# Patient Record
Sex: Male | Born: 1955 | Race: White | Hispanic: No | Marital: Married
Health system: Southern US, Community
[De-identification: ages and names within clinical notes are randomized; demographics above are authoritative.]

---

## 2003-07-08 ENCOUNTER — Other Ambulatory Visit: Payer: Self-pay

## 2008-11-02 ENCOUNTER — Ambulatory Visit: Payer: Self-pay | Admitting: Gastroenterology

## 2011-01-01 ENCOUNTER — Ambulatory Visit: Payer: Self-pay | Admitting: Internal Medicine

## 2011-01-01 ENCOUNTER — Inpatient Hospital Stay: Payer: Self-pay | Admitting: Internal Medicine

## 2011-01-09 LAB — PATHOLOGY REPORT

## 2011-03-13 ENCOUNTER — Ambulatory Visit: Payer: Self-pay | Admitting: Surgery

## 2011-05-09 ENCOUNTER — Ambulatory Visit: Payer: Self-pay | Admitting: Surgery

## 2011-05-16 ENCOUNTER — Inpatient Hospital Stay: Payer: Self-pay | Admitting: Surgery

## 2011-12-28 ENCOUNTER — Inpatient Hospital Stay: Payer: Self-pay | Admitting: Internal Medicine

## 2011-12-28 LAB — COMPREHENSIVE METABOLIC PANEL
Anion Gap: 9 (ref 7–16)
Bilirubin,Total: 0.8 mg/dL (ref 0.2–1.0)
Chloride: 103 mmol/L (ref 98–107)
Co2: 30 mmol/L (ref 21–32)
Creatinine: 1.27 mg/dL (ref 0.60–1.30)
EGFR (African American): >60
EGFR (Non-African Amer.): >60
Osmolality: 286 (ref 275–301)
Potassium: 2.9 mmol/L — ABNORMAL LOW (ref 3.5–5.1)
Sodium: 142 mmol/L (ref 136–145)

## 2011-12-28 LAB — URINALYSIS, COMPLETE
Bacteria: NONE SEEN
Bilirubin,UR: NEGATIVE
Blood: NEGATIVE
Glucose,UR: NEGATIVE mg/dL (ref 0–75)
Hyaline Cast: 3
Ketone: NEGATIVE
Leukocyte Esterase: NEGATIVE
Specific Gravity: 1.009 (ref 1.003–1.030)
Squamous Epithelial: NONE SEEN
WBC UR: 1 /HPF (ref 0–5)

## 2011-12-28 LAB — CBC
HCT: 54.8 % — ABNORMAL HIGH (ref 40.0–52.0)
HGB: 18.4 g/dL — ABNORMAL HIGH (ref 13.0–18.0)
MCH: 34.2 pg — ABNORMAL HIGH (ref 26.0–34.0)
MCHC: 33.6 g/dL (ref 32.0–36.0)
Platelet: 242 10*3/uL (ref 150–440)
RDW: 13.4 % (ref 11.5–14.5)
WBC: 10.8 10*3/uL — ABNORMAL HIGH (ref 3.8–10.6)

## 2011-12-28 LAB — CK TOTAL AND CKMB (NOT AT ARMC)
CK, Total: 107 U/L (ref 35–232)
CK-MB: 1.6 ng/mL (ref 0.5–3.6)

## 2011-12-28 LAB — TROPONIN I
Troponin-I: 0.06 ng/mL — ABNORMAL HIGH
Troponin-I: 0.19 ng/mL — ABNORMAL HIGH

## 2011-12-29 LAB — CK TOTAL AND CKMB (NOT AT ARMC): CK-MB: 1.6 ng/mL (ref 0.5–3.6)

## 2011-12-29 LAB — BASIC METABOLIC PANEL
BUN: 16 mg/dL (ref 7–18)
Calcium, Total: 8.2 mg/dL — ABNORMAL LOW (ref 8.5–10.1)
Creatinine: 0.78 mg/dL (ref 0.60–1.30)
EGFR (African American): >60
EGFR (Non-African Amer.): >60
Glucose: 91 mg/dL (ref 65–99)
Potassium: 3.3 mmol/L — ABNORMAL LOW (ref 3.5–5.1)

## 2011-12-29 LAB — CBC WITH DIFFERENTIAL/PLATELET
Basophil #: 0.1 10*3/uL (ref 0.0–0.1)
Basophil %: 0.9 %
Eosinophil %: 1.9 %
Lymphocyte #: 3.3 10*3/uL (ref 1.0–3.6)
Lymphocyte %: 22.2 %
MCV: 102 fL — ABNORMAL HIGH (ref 80–100)
Monocyte %: 5.4 %
Neutrophil #: 10.3 10*3/uL — ABNORMAL HIGH (ref 1.4–6.5)
RDW: 13.1 % (ref 11.5–14.5)

## 2011-12-29 LAB — TROPONIN I: Troponin-I: 0.07 ng/mL — ABNORMAL HIGH

## 2011-12-29 LAB — MAGNESIUM: Magnesium: 1.8 mg/dL

## 2012-05-02 ENCOUNTER — Emergency Department: Payer: Self-pay | Admitting: Emergency Medicine

## 2012-05-02 LAB — COMPREHENSIVE METABOLIC PANEL
Alkaline Phosphatase: 83 U/L (ref 50–136)
BUN: 14 mg/dL (ref 7–18)
Bilirubin,Total: 0.6 mg/dL (ref 0.2–1.0)
Calcium, Total: 8.8 mg/dL (ref 8.5–10.1)
Co2: 29 mmol/L (ref 21–32)
Creatinine: 0.98 mg/dL (ref 0.60–1.30)
EGFR (Non-African Amer.): >60
Glucose: 112 mg/dL — ABNORMAL HIGH (ref 65–99)
Osmolality: 286 (ref 275–301)
Potassium: 3.1 mmol/L — ABNORMAL LOW (ref 3.5–5.1)
SGOT(AST): 25 U/L (ref 15–37)
SGPT (ALT): 33 U/L (ref 12–78)
Sodium: 143 mmol/L (ref 136–145)

## 2012-05-02 LAB — CBC
HCT: 46.1 % (ref 40.0–52.0)
MCH: 35.8 pg — ABNORMAL HIGH (ref 26.0–34.0)
MCHC: 35.3 g/dL (ref 32.0–36.0)
Platelet: 289 10*3/uL (ref 150–440)
RBC: 4.55 10*6/uL (ref 4.40–5.90)
RDW: 12.4 % (ref 11.5–14.5)
WBC: 8.6 10*3/uL (ref 3.8–10.6)

## 2012-05-02 LAB — CK TOTAL AND CKMB (NOT AT ARMC)
CK, Total: 96 U/L (ref 35–232)
CK-MB: 1.9 ng/mL (ref 0.5–3.6)

## 2012-05-02 LAB — TROPONIN I: Troponin-I: <0.02 ng/mL

## 2013-02-07 IMAGING — US US CAROTID DUPLEX BILAT
1 series · 14 of 24 positions shown · non-contrast
Comparison: none

REASON FOR EXAM: syncope/presyncope
COMMENTS:

[Series 1: us carotid duplex bilat · 14 of 64 slices shown]
[im 1/64]
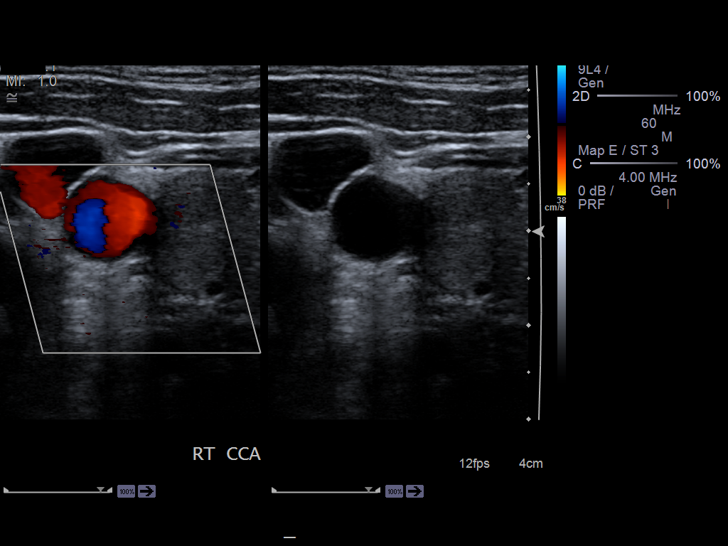
[im 6/64]
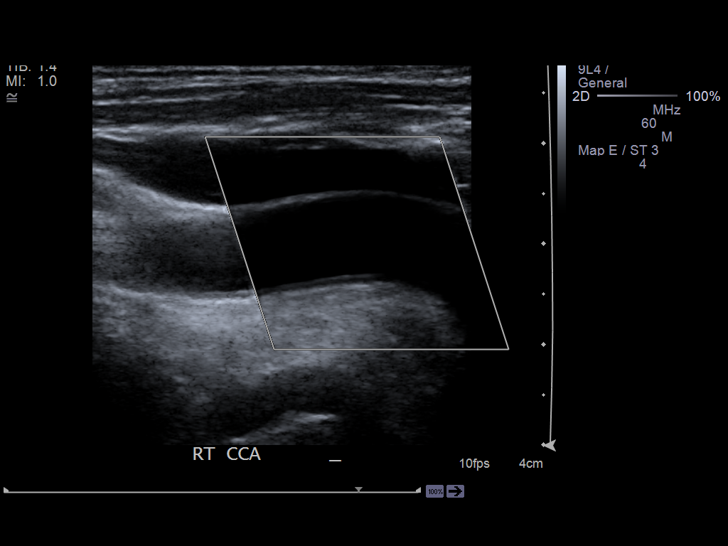
[im 11/64]
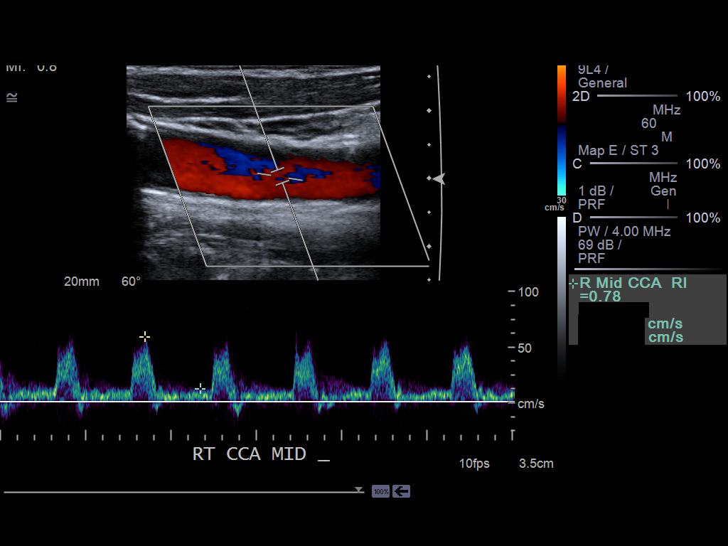
[im 17/64]
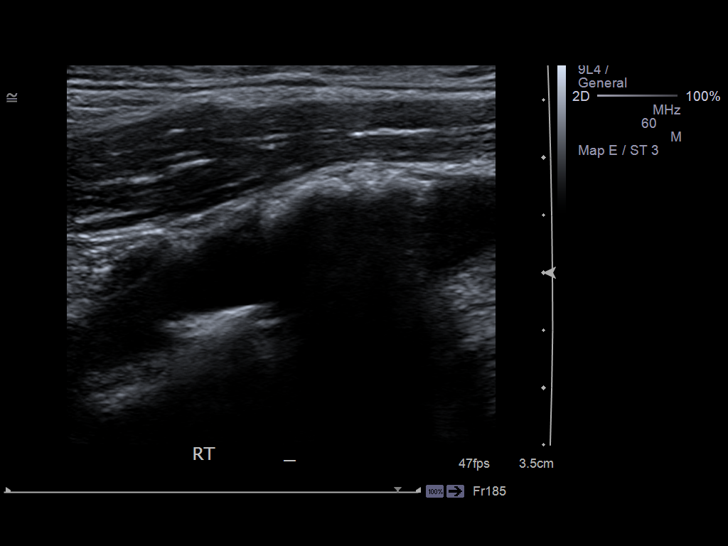
[im 20/64]
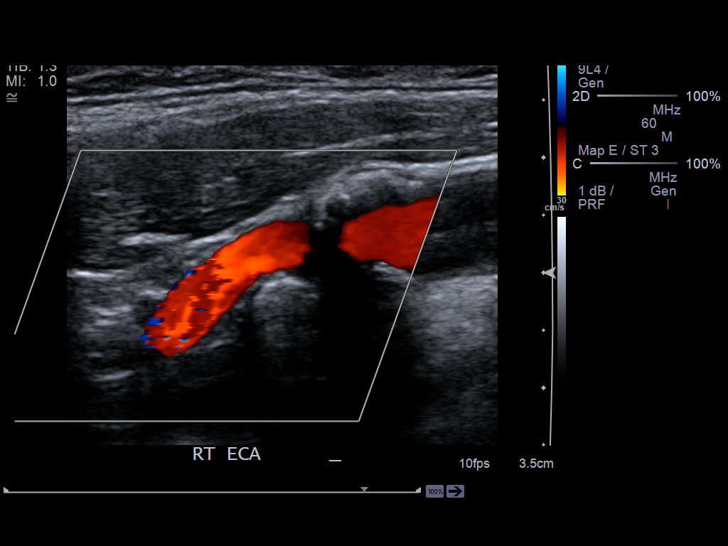
[im 25/64]
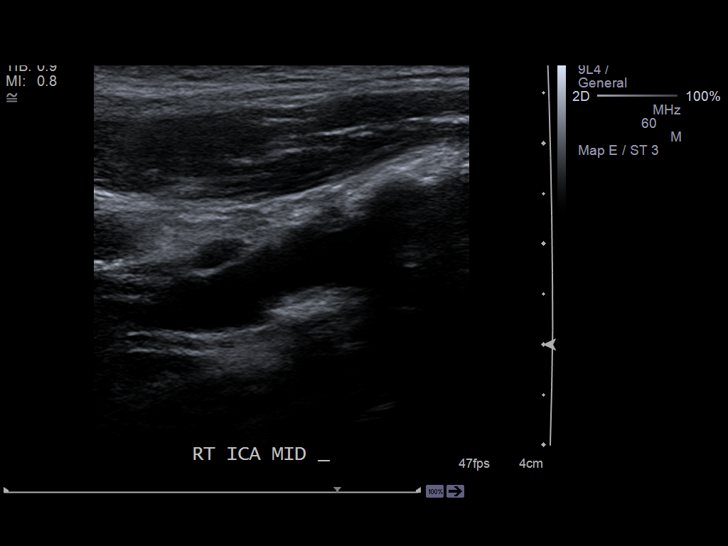
[im 31/64]
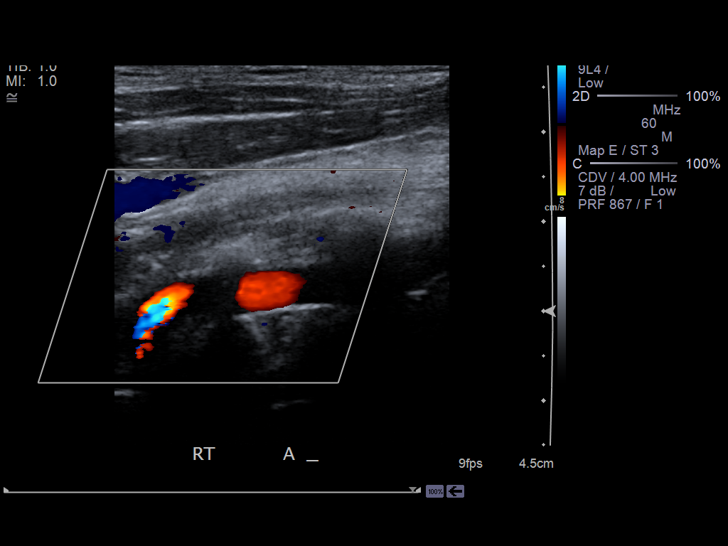
[im 33/64]
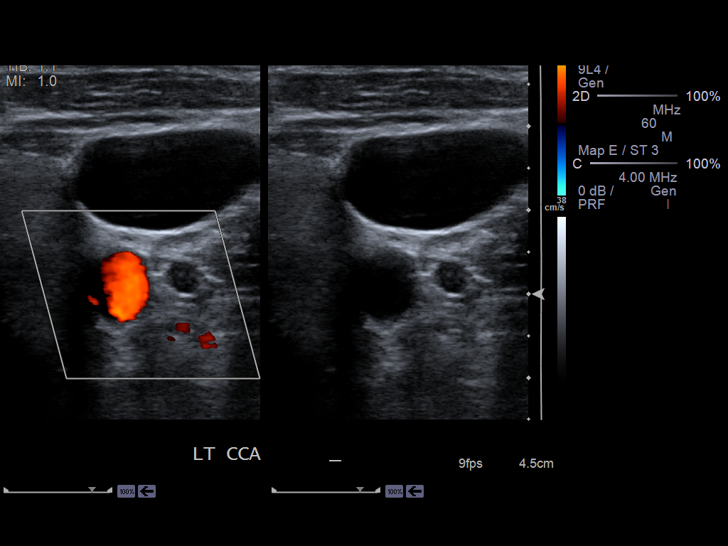
[im 39/64]
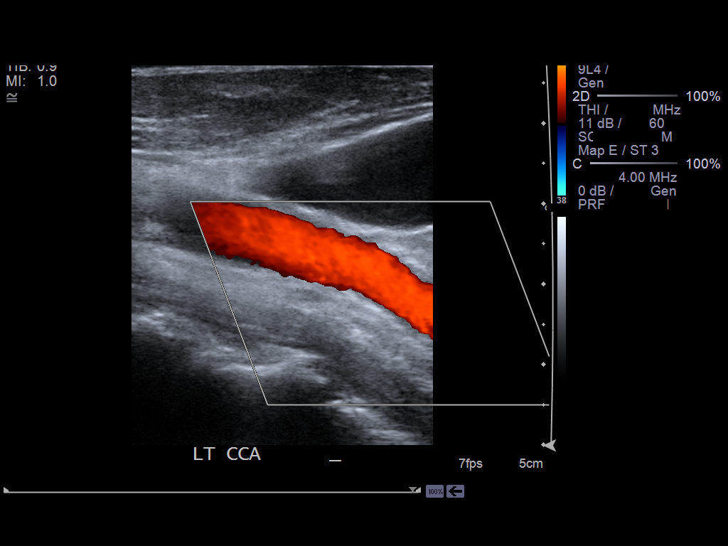
[im 44/64]
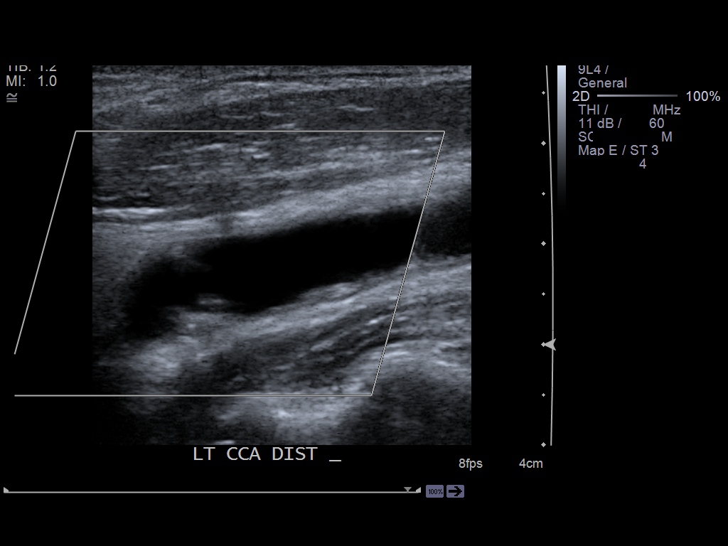
[im 50/64]
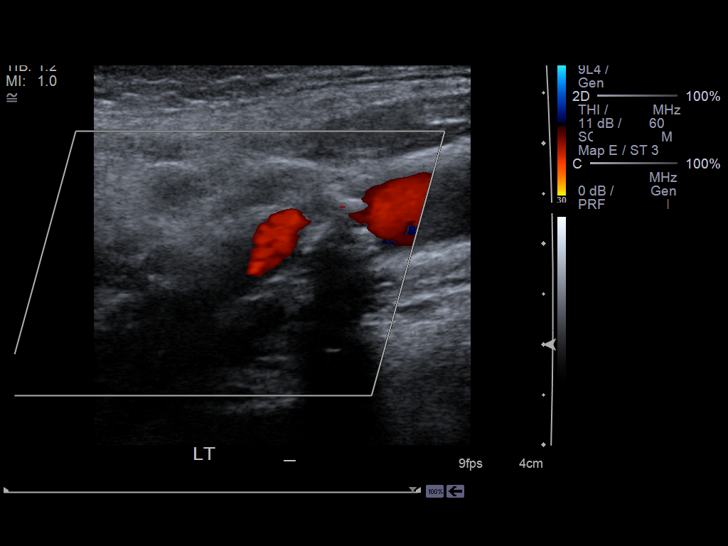
[im 53/64]
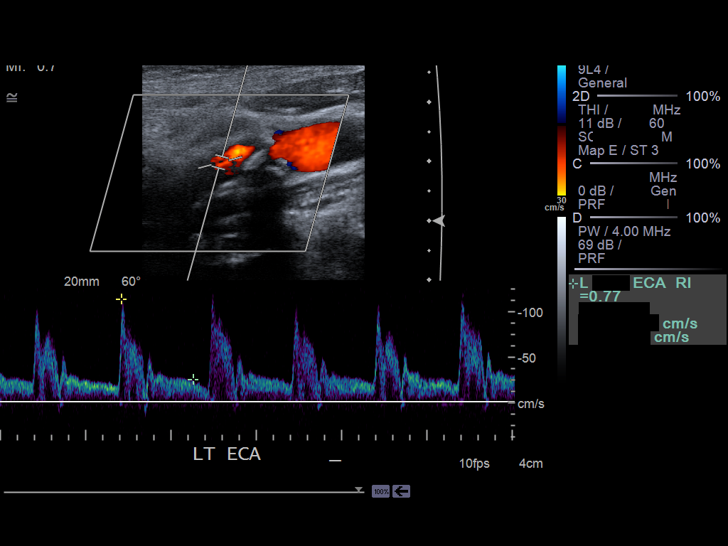
[im 58/64]
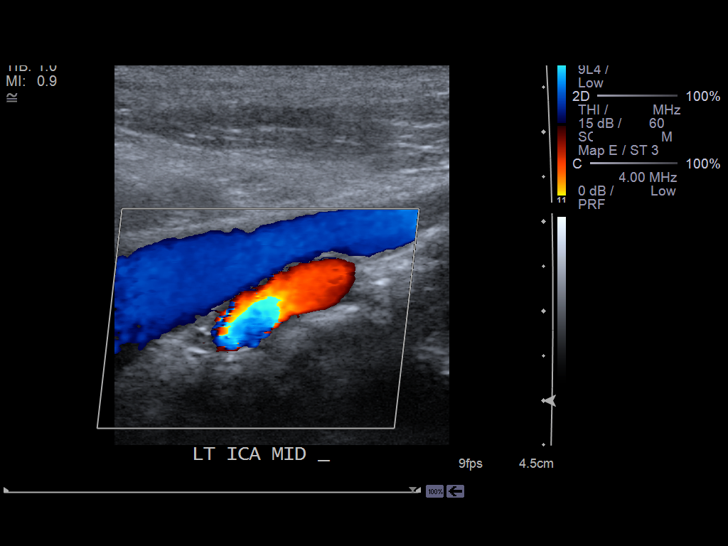
[im 64/64]
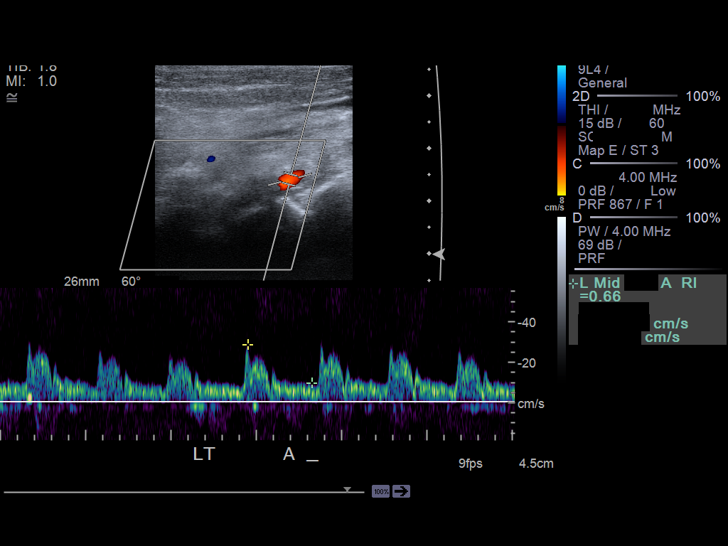

[14 of 24 positions shown; findings below may reference images not displayed]

PROCEDURE:     US  - US CAROTID DOPPLER BILATERAL  - December 29, 2011  [DATE]

RESULT:     Standard carotid color flow duplex Doppler reveals calcific
plaque at both carotid bifurcations with degree of stenosis less than 50%.
Peak systolic flow velocity ratio the right is 1.6, left 1.1. Vertebrals are
patent with antegrade flow.
IMPRESSION: Bilateral carotid atherosclerotic vascular disease with
degree of stenosis less than 50%.

## 2013-04-11 DEATH — deceased

## 2014-09-28 NOTE — H&P (Signed)
PATIENT NAME:  Darren Morrow MR#:  [id number] DATE OF BIRTH:  [birth year]  DATE OF ADMISSION:  12/28/2011  PRIMARY CARE PHYSICIAN: Vonita Moss, MD   ER REFERRING PHYSICIAN:  Glennie Isle, MD     CHIEF COMPLAINT: Trouble breathing.   HISTORY OF PRESENT ILLNESS: The patient is a 59 year old male with a history of hypertension who came in because he had trouble breathing.  The patient was working on a pressure washing machine to take it to the patient's wife's office to do the pressure washing, and on the way to the office the patient had sudden onset of shortness of breath with diaphoresis and dizziness.  He called the ambulance, and in the ambulance he was given oxygen and brought in here.  He is found to have a slightly elevated troponin of 0.06 along with hyperkalemia and hypertension.  I was asked to admit for heat exhaustion with hyperkalemia and also non ST-elevation myocardial infarction. The patient denies any chest pain, has mild trouble breathing, no cough, and the patient has no wheezing, and shortness of breath is since this morning.  He has been working outside the whole week on the yard, and the trouble breathing happened this morning with sudden onset with dizziness, and he was about to pass out. He did not have any chest pain. The patient did have some nausea. The patient took some baby aspirin at home.   PAST MEDICAL HISTORY:  1. Hypertension.  2. The patient also has history of admission in 05/2011 colostomy closure, and the patient had a history of diverticulitis before. He had a history of diverticulitis with perforation and abscess in August and at that  time he had sigmoid colectomy and colostomy. In December he had a reversal of colostomy.   ALLERGIES: Norco.   SOCIAL HISTORY: Heavy smoker up to 2 packs per day. The patient is a very heavy alcoholic, drinks heavily. No drugs.   MEDICATIONS:  1. Aspirin 81 mg daily. 2. Benicar HCTZ 40/25 mg p.o. daily.  3. Coreg CR  40 mg p.o. daily.  4. Diltiazem 360 mg daily.   FAMILY HISTORY: The patient's mother had a history of emphysema and angina. Father had hypertension.   PAST SURGICAL HISTORY: Sigmoid colectomy.   REVIEW OF SYSTEMS: CONSTITUTIONAL: He has no fever. No fatigue. EYES: No blurred vision. ENT: No tinnitus. No epistaxis. No difficulty swallowing. RESPIRATORY: Has chronic smokers cough and trouble breathing this morning. CARDIOVASCULAR: No chest pain. No palpitations. He was about to pass out and trouble breathing happened, but he did not pass out. GASTROINTESTINAL: No nausea. No vomiting. No constipation. GENITOURINARY: No dysuria. INTEGUMENTARY: No skin rashes. MUSCULOSKELETAL: No joint pain. NEUROLOGIC: No numbness or weakness. PSYCHIATRIC: No anxiety or insomnia.   PHYSICAL EXAMINATION:  VITAL SIGNS: Temperature 96.2, pulse 61, blood pressure initially was 100/77, but a repeat blood pressure was 140/80 and pulse 61.   GENERAL: The patient is an alert, awake, oriented and pleasant male has a chronic ill-looking appearance.   HEENT: Pallor. Extraocular movements are intact. Sclera anicteric. No conjunctivitis. Hearing is intact. Tympanic membranes are clear. No pharynx erythema. Mucous membranes are slightly dry.   NECK: Thyroid not enlarged. No JVD. No thyromegaly. No lymphadenopathy.   RESPIRATORY: Mild expiratory wheeze in all lung fields. Not using accessory muscles of respiration.   CARDIOVASCULAR: S1 and S2 regular. No murmurs. PMI. not displaced. Good pedal pulses present and good femoral pulses.   ABDOMEN: Soft, nontender, nondistended. Bowel sounds present.   MUSCULOSKELETAL: Strength  5/5 in upper and lower extremities.  SKIN: No skin rashes. No lymphadenopathy.   NEUROLOGIC: Alert, awake, oriented. Cranial nerves II through XII intact. Power 5/5 in upper and lower extremities. Sensations are intact. Deep tendon reflexes are 2+  bilaterally.   PSYCHIATRIC: Slightly anxious, oriented  to time, place, person and cooperative.   LABORATORY, DIAGNOSTIC AND RADIOLOGICAL DATA: The patient's chest x-ray showed mild hyperinflation with chronic obstructive pulmonary disease, no evidence of pneumonia. Cannot exclude acute bronchitis. CK total 107, CPK-MB 1.6. WBC is slightly elevated at 10.8, hemoglobin 18.4, hematocrit 54.8, platelets 242. Electrolytes: Sodium 142, potassium 2.9, chloride 103, bicarbonate 30, BUN 17, creatinine 1.27, glucose 120. EKG showed sinus brady 59 beats and right bundle branch block and no other ST-T changes. Troponin is slightly elevated at 0.06.   ASSESSMENT AND PLAN: The patient is a 59 year old male with:  1. Sudden onset of shortness of breath with dizziness and hypokalemia: Possibly secondary to heat exhaustion, working in the outside the whole week. So, we are going to admit him and give him IV fluids with potassium, and hold his Benicar and his HCTZ combination, and monitor him on telemetry.  2. History of hypertension: Blood pressure is borderline, so we will hold the blood pressure medications, including Coreg and diltiazem. Heart rate is around 61, so if heart rate goes up to 100 we can restart the Coreg ER.  3. Slightly elevated troponins, has no chest pain: No prior EKGs to compare. The patient was here in November and December, but I do not see any EKG. The patient says that he sees Dr. Darrold JunkerParaschos, had a Cardiology clearance before his diverticular surgery. So, the patient at this time has no chest pain, and we are going to try to get troponins and get echocardiogram. Continue aspirin and use sublingual nitroglycerin as needed for chest pain.  4. Dyspnea with some wheezing: He has chronic obstructive pulmonary disease with severe history of smoking. So at this time we can use Combivent metered-dose inhaler along with Spiriva and also add  antibiotics because he has symptoms of some cough and also trouble breathing, and chest x-ray evidence of bronchitis  ,elevated white count, so we will start him on Z-Pak.   CODE STATUS:  I discussed the CODE STATUS with the patient and he is FULL CODE.      TIME SPENT ON ADMISSION: About 60 minutes  ____________________________ Katha HammingSnehalatha Naaman Curro, MD sk:cbb D: 12/28/2011 14:50:17 ET T: 12/28/2011 15:05:39 ET JOB#: [id number]  cc: Katha HammingSnehalatha Taji Sather, MD, <Dictator> Katha HammingSNEHALATHA Praveen Coia MD ELECTRONICALLY SIGNED 01/14/2012 21:54

## 2014-09-28 NOTE — Discharge Summary (Signed)
PATIENT NAME:  Darren Morrow, Darren Morrow MR#:  [id number] DATE OF BIRTH:  1956-02-02  DATE OF ADMISSION:  12/28/2011 DATE OF DISCHARGE:  12/29/2011  ADMITTING DIAGNOSIS: Presyncope, questionable heat exhaustion.  DISCHARGE DIAGNOSES: 1. Presyncope, diaphoresis, dyspnea, questionable heat exhaustion.  2. Chronic obstructive pulmonary disease exacerbation, acute bronchitis, ongoing tobacco abuse.  3. Elevated troponin. No cardiac injury. 4. Bilateral atherosclerotic carotid disease with degree of stenosis less than 50%.. 5. History of alcohol abuse.  6. History of hypertension.  DISCHARGE INSTRUCTIONS: The patient was advised to stop smoking.   DISCHARGE CONDITION: Stable.   DISCHARGE MEDICATIONS: The patient is to continue: 1. Aspirin 81 mg p.o. daily.  2. Benicar/HCT 40/25 mg p.o. daily.  3. Coreg CR 60 mg p.o. daily.  4. Diltiazem extended-release 360 mg p.o. daily.  ADDITIONAL MEDICATIONS:  1. Combivent Respimat 1 puff 4 times daily as needed.  2. Spiriva 1 capsule inhalation daily.  3. Nitrostat sublingually 0.4 mg every five minutes as needed.  4. Zithromax 250 mg p.o. daily for four more days.  5. Nicotine patch 14 mg topically daily.  6. Nicotine oral inhaler one cartridge every one hour as needed.  HOME OXYGEN: None.   DIET: 2 gram salt, low fat, low cholesterol, regular consistency. The patient was advised to lose weight and to watch what he eats.   ACTIVITY LIMITATIONS: As tolerated.   FOLLOW-UP:  1. Follow-up appointment with Dr. Dossie Arbour, his primary care physician, in two days after discharge. 2. Follow-up with Dr. Darrold Junker for possible outpatient stress test in two days after discharge.   CONSULTANTS: None.   RADIOLOGIC STUDIES:  1. Chest x-ray, portable, single view, 12/28/2011, showed mild hyperinflation which suggests COPD. There is no evidence of pneumonia or CHF. Cannot exclude acute bronchitis in the appropriate clinical setting according to the  radiologist. 2. Carotid ultrasound on 12/29/2011 bilateral carotid atherosclerotic vascular disease with degree of stenosis less than 50%. Stress test reported by nurse taking care of patient which was negative telephone report.   REASON FOR ADMISSION: The patient is a 59 year old Caucasian male with past medical history significant for history of ongoing tobacco abuse who presented to the hospital with complaints of trouble breathing. Please refer to Dr. Suzanne Boron admission note on 12/28/2011.  PHYSICAL EXAMINATION: On arrival to the hospital, the patient's temperature was 96.2, pulse 61, respiration rate was not measured, blood pressure initially was 100/77, later was only 140/80 and pulse was 61.   LABORATORY DATA: EKG showed sinus brady at 59 beats per minute, right bundle branch block, and no acute ST-T changes.   BMP was unremarkable except potassium level was low at 2.9 and glucose was elevated to 120. Liver enzymes showed mild AST elevation to 38. Cardiac enzymes, first set, revealed troponin of 0.06; second set 0.19; third set 0.07. White blood cell count was slightly elevated at 10.8, hemoglobin 18.4, platelet count 242. Urinalysis yellow hazy urine, negative for glucose, bilirubin, or ketones, specific gravity 1.009, pH 6.0, negative for blood, protein, nitrites or leukocyte esterase, less than 1 red blood cell and 1 white blood cell. No bacteria or epithelial cells were noted. Mucus was present and 3 hyaline casts.   Chest x-ray, as mentioned above, was unremarkable.   HOSPITAL COURSE: The patient was admitted to the hospital for further evaluation. Because of his hemoconcentration it was felt that he was somewhat dehydrated and, as mentioned above, he was hypotensive initially on arrival to the hospital. He was started on IV fluids and continued on IV  fluids in the hospital. He was also evaluated for presyncope, significant shortness of breath, as well as diaphoresis. He underwent stress  test for his heart which was reportedly negative and he also had carotid ultrasound which was also unremarkable for any abnormalities. He was rehydrated and felt much more comfortable the next day. It was felt that the patient's presyncope spells could have been related to heat exhaustion as well as possibly hypotension and some dehydration. He was also noted to have cough with some phlegm production and wheezing. It was felt that the patient did have mild COPD exacerbation with acute bronchitis exacerbated by ongoing tobacco abuse. He was advised to continue inhalation therapy which was prescribed for him upon discharge. He is going to have Combivent as well as Spiriva and he was advised also to continue Zithromax for four more days to complete course. He was also advised to quit smoking and counseling was provided for him and he was agreeable to quit smoking. Nicotine patch as well as nicotine oral inhaler was prescribed for him upon discharge. The patient is to follow-up with Dr. Dossie Arbourrissman in two days after discharge and possibly undergo pulmonary function tests as outpatient whenever his lungs are better.    In regards to elevated troponin, it was felt it was nonspecific. The patient did not have any pains in his chest and EKG was unremarkable. There was no chest pain reported and no cardiac injury was noted with this mild elevation of troponin. However, the patient was advised to follow-up with his primary care physician as well as cardiologist, Dr. Darrold JunkerParaschos, for possible outpatient stress test. The patient was ambulated prior to discharge. As he did not have any symptoms, he was discharged.   In regards to carotid artery disease, the patient's carotid arteries exhibited less than 50% stenosis. The patient was advised to continue aspirin. He is also to follow-up with his primary care physician and make decisions about initiation of cholesterol lowering medications. Meanwhile, the patient was advised to  continue a very strict low cholesterol, low fat diet in order not to exacerbate his carotid artery disease.   For alcohol abuse, he was advised to quit drinking alcohol. His liver enzymes were slightly elevated according to pattern of alcohol use and abuse, however, the patient had no withdrawal symptoms.   For smoking, as mentioned above he underwent extensive counseling and he was advised to quit. Nicotine replacement therapy was initiated.   DISPOSITION: The patient is being discharged in stable condition with the above-mentioned medications and follow-up.  On day of discharge, his temperature is 97.3, pulse 71, respiration rate was 20, blood pressure was fluctuating as the patient did not receive his morning medications. Systolic blood pressure ranging from 156 to 180. The patient was advised to take his home medications and to check his blood pressure as outpatient. The patient was exercised in the hospital. His oxygen saturation was checked prior to discharge from the hospital and oxygen saturation was 96% on room air on exertion. He is being discharged in stable condition with the above-mentioned medications and follow-up.   TIME SPENT: 40 minutes.  ____________________________ Katharina Caperima Dmitriy Gair, MD rv:drc D: 12/29/2011 19:03:33 ET T: 12/31/2011 11:48:01 ET JOB#: [id number]  cc: Katharina Caperima Rolanda Campa, MD, <Dictator> Steele SizerMark A. Crissman, MD Marcina MillardAlexander Paraschos, MD Deandre Stansel MD ELECTRONICALLY SIGNED 01/02/2012 16:25

## 2014-10-03 NOTE — Discharge Summary (Signed)
PATIENT NAME:  Darren LenzMITCHELL, Mark W MR#:  [id number] DATE OF BIRTH:  1955/09/20  DATE OF ADMISSION:  05/16/2011 DATE OF DISCHARGE:  05/18/2011  ADMITTING PHYSICIAN: Renda RollsWilton Smith, MD  ADMITTING DIAGNOSIS: Diverticulitis and hypertension.   DISCHARGE DIAGNOSIS: Diverticulitis and hypertension.   HISTORY OF PRESENT ILLNESS: This is a 59 year old male with a history of diverticulitis with sigmoid resection and colostomy several months ago. He returned today for colostomy closure.   HOSPITAL COURSE: On 05/16/2011 the patient underwent laparoscopic closure of colostomy with resection and low pelvic anastomosis. The patient did well with the procedure and was returned to the floor for recovery. He required minimal pain medication after surgery. He was ambulating on postoperative day number one. He was able to start clear liquids at this time. Blood pressure was elevated and he was restarted on all of his home medications with better control. He had a bowel movement on postoperative day number two and was then able to advance diet further. Later on postoperative day number two he was examined by Dr. Katrinka BlazingSmith and deemed appropriate for discharge. He did have some skin blisters from the tape surrounding the incision. Incisions were otherwise intact without localized erythema or drainage. The discharge plan was discussed with the patient.   DISPOSITION: Discharged home with self-care in good condition.   DISCHARGE MEDICATIONS:  1. Coreg CR 40 mg daily.  2. Aspirin 81 mg daily.  3. Diltiazem 180 mg daily.  4. Benicar HCTZ 40/25 daily.  5. Tylenol as needed for pain.   DISCHARGE INSTRUCTIONS:  1. Apply Vaseline to blisters as needed. No other dressings needed.  2. May shower.  3. Regular diet.  4. No heavy lifting. 5. Follow-up in the office in one week for suture removal.  6. Call with any concerns such as fever, increasing abdominal pain, nausea, vomiting, or drainage from the  incision.   ____________________________ Ruffin FrederickAnn M. Thomasena Edisollins, [place] amc:rbg D: 06/13/2011 08:29:10 ET T: 06/14/2011 13:41:49 ET JOB#: [id number]  cc: Ruffin FrederickAnn M. Thomasena Edisollins, [place], <Dictator> Dawnyel Leven M Hyden Soley PA ELECTRONICALLY SIGNED 06/14/2011 15:21
